# Patient Record
Sex: Male | Born: 2000 | Race: Black or African American | Hispanic: No | Marital: Single | Smoking: Current every day smoker
Health system: Southern US, Community
[De-identification: ages and names within clinical notes are randomized; demographics above are authoritative.]

---
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
## 2020-07-05 ENCOUNTER — Encounter: Payer: Self-pay | Admitting: Emergency Medicine

## 2020-07-05 ENCOUNTER — Emergency Department
Admission: EM | Admit: 2020-07-05 | Discharge: 2020-07-05 | Disposition: A | Discharge status: Home / Self Care | Payer: Medicaid Other | Attending: Student in an Organized Health Care Education/Training Program | Admitting: Student in an Organized Health Care Education/Training Program

## 2020-07-05 ENCOUNTER — Emergency Department: Payer: Medicaid Other

## 2020-07-05 ENCOUNTER — Other Ambulatory Visit: Payer: Self-pay

## 2020-07-05 DIAGNOSIS — S6992XA Unspecified injury of left wrist, hand and finger(s), initial encounter: Secondary | ICD-10-CM

## 2020-07-05 DIAGNOSIS — W278XXA Contact with other nonpowered hand tool, initial encounter: Secondary | ICD-10-CM | POA: Insufficient documentation

## 2020-07-05 DIAGNOSIS — Y93E5 Activity, floor mopping and cleaning: Secondary | ICD-10-CM | POA: Insufficient documentation

## 2020-07-05 DIAGNOSIS — Z23 Encounter for immunization: Secondary | ICD-10-CM | POA: Insufficient documentation

## 2020-07-05 DIAGNOSIS — F17210 Nicotine dependence, cigarettes, uncomplicated: Secondary | ICD-10-CM | POA: Insufficient documentation

## 2020-07-05 DIAGNOSIS — S68625A Partial traumatic transphalangeal amputation of left ring finger, initial encounter: Secondary | ICD-10-CM | POA: Insufficient documentation

## 2020-07-05 MED ORDER — BACITRACIN ZINC 500 UNIT/GM EX OINT
TOPICAL_OINTMENT | Freq: Once | CUTANEOUS | Status: AC
  Administered 2020-07-05: 1 via TOPICAL
  Filled 2020-07-05: qty 1.8

## 2020-07-05 MED ORDER — TETANUS-DIPHTH-ACELL PERTUSSIS 5-2.5-18.5 LF-MCG/0.5 IM SUSY
0.5000 mL | PREFILLED_SYRINGE | Freq: Once | INTRAMUSCULAR | Status: AC
  Administered 2020-07-05: 0.5 mL via INTRAMUSCULAR
  Filled 2020-07-05: qty 0.5

## 2020-07-05 MED ORDER — ONDANSETRON HCL 4 MG/2ML IJ SOLN
INTRAMUSCULAR | Status: AC
  Filled 2020-07-05: qty 2

## 2020-07-05 MED ORDER — MORPHINE SULFATE (PF) 4 MG/ML IV SOLN
6.0000 mg | Freq: Once | INTRAVENOUS | Status: AC
  Administered 2020-07-05: 6 mg via INTRAVENOUS
  Filled 2020-07-05: qty 2

## 2020-07-05 MED ORDER — BUPIVACAINE HCL (PF) 0.5 % IJ SOLN
30.0000 mL | Freq: Once | INTRAMUSCULAR | Status: AC
Start: 2020-07-05 — End: 2020-07-05
  Administered 2020-07-05: 30 mL
  Filled 2020-07-05: qty 30

## 2020-07-05 MED ORDER — ONDANSETRON HCL 4 MG/2ML IJ SOLN
4.0000 mg | Freq: Once | INTRAMUSCULAR | Status: AC
  Administered 2020-07-05: 4 mg via INTRAVENOUS

## 2020-07-05 MED ORDER — CEPHALEXIN 500 MG PO CAPS: 500.0000 mg | ORAL_CAPSULE | Freq: Three times a day (TID) | ORAL | 0 refills | Status: AC

## 2020-07-05 MED ORDER — LIDOCAINE HCL (PF) 1 % IJ SOLN
5.0000 mL | Freq: Once | INTRAMUSCULAR | Status: AC
  Administered 2020-07-05: 5 mL via INTRADERMAL
  Filled 2020-07-05: qty 5
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
## 2020-07-05 NOTE — ED Notes (Signed)
X-ray at bedside

## 2020-07-05 NOTE — ED Notes (Signed)
Pt provided meal tray, ok per EDP
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
## 2020-12-18 ENCOUNTER — Emergency Department: Payer: Self-pay

## 2020-12-18 ENCOUNTER — Other Ambulatory Visit: Payer: Self-pay

## 2020-12-18 ENCOUNTER — Ambulatory Visit
Admission: EM | Admit: 2020-12-18 | Discharge: 2020-12-18 | Discharge status: Against Medical Advice | Payer: Medicaid Other

## 2020-12-18 ENCOUNTER — Emergency Department
Admission: EM | Admit: 2020-12-18 | Discharge: 2020-12-18 | Disposition: A | Discharge status: Home / Self Care | Payer: Self-pay | Attending: Emergency Medicine | Admitting: Emergency Medicine

## 2020-12-18 DIAGNOSIS — R42 Dizziness and giddiness: Secondary | ICD-10-CM | POA: Insufficient documentation

## 2020-12-18 DIAGNOSIS — R5381 Other malaise: Secondary | ICD-10-CM | POA: Insufficient documentation

## 2020-12-18 DIAGNOSIS — F17210 Nicotine dependence, cigarettes, uncomplicated: Secondary | ICD-10-CM | POA: Insufficient documentation

## 2020-12-18 DIAGNOSIS — N200 Calculus of kidney: Secondary | ICD-10-CM | POA: Insufficient documentation

## 2020-12-18 LAB — URINALYSIS, COMPLETE (UACMP) WITH MICROSCOPIC
Bilirubin Urine: NEGATIVE
Glucose, UA: NEGATIVE mg/dL
Ketones, ur: 80 mg/dL — AB
Leukocytes,Ua: NEGATIVE
Nitrite: NEGATIVE
Protein, ur: 100 mg/dL — AB
RBC / HPF: >50 RBC/hpf — ABNORMAL HIGH (ref 0–5)
Specific Gravity, Urine: 1.031 — ABNORMAL HIGH (ref 1.005–1.030)
pH: 6 (ref 5.0–8.0)

## 2020-12-18 MED ORDER — LACTATED RINGERS IV BOLUS
1000.0000 mL | Freq: Once | INTRAVENOUS | Status: AC
  Administered 2020-12-18: 1000 mL via INTRAVENOUS

## 2020-12-18 MED ORDER — KETOROLAC TROMETHAMINE 15 MG/ML IJ SOLN
15.0000 mg | Freq: Once | INTRAMUSCULAR | Status: DC
  Filled 2020-12-18: qty 1

## 2020-12-18 MED ORDER — KETOROLAC TROMETHAMINE 30 MG/ML IJ SOLN
INTRAMUSCULAR | Status: AC
  Administered 2020-12-18: 15 mg
  Filled 2020-12-18: qty 1

## 2020-12-18 MED ORDER — ACETAMINOPHEN 500 MG PO TABS
1000.0000 mg | ORAL_TABLET | Freq: Once | ORAL | Status: AC
  Administered 2020-12-18: 1000 mg via ORAL
  Filled 2020-12-18: qty 2

## 2020-12-18 MED ORDER — ONDANSETRON HCL 4 MG/2ML IJ SOLN
4.0000 mg | Freq: Once | INTRAMUSCULAR | Status: AC
  Administered 2020-12-18: 4 mg via INTRAVENOUS
  Filled 2020-12-18: qty 2

## 2020-12-18 NOTE — ED Triage Notes (Signed)
Pt comes with c/o generalized body aches. Pt states chills and hot flashes.

## 2020-12-18 NOTE — ED Provider Notes (Signed)
[facility]  ____________________________________________   Event Date/Time   First MD Initiated Contact with Patient 12/18/20 1551     (approximate)  I have reviewed the triage vital signs and the nursing notes.   HISTORY  Chief Complaint Generalized Body Aches    HPI Tabb L Arty Lantzy is a 20 y.o. male no past medical history who presents with right flank pain.  He noticed the pain when he woke up this morning.  Pain is located on the right side, does not radiate.  It is been constant since onset.  Has been associated with some lightheadedness and nausea but denies vomiting.  No urinary symptoms.  No fevers chills.  No chest pain or shortness of breath.  No history of kidney stones.  Denies radiation of the pain.         History reviewed. No pertinent past medical history.  There are no problems to display for this patient.   History reviewed. No pertinent surgical history.  Prior to Admission medications   Not on File    Allergies Patient has no known allergies.  No family history on file.  Social History Social History   Tobacco Use   Smoking status: Every Day    Packs/day: 0.25    Types: Cigarettes   Smokeless tobacco: Never  Substance Use Topics   Alcohol use: Yes    Review of Systems   Review of Systems  Constitutional:  Negative for activity change, chills and fever.  HENT:  Negative for congestion.   Respiratory:  Negative for shortness of breath.   Cardiovascular:  Negative for chest pain.  Gastrointestinal:  Positive for nausea. Negative for abdominal pain and vomiting.  Genitourinary:  Positive for flank pain. Negative for dysuria and hematuria.  All other systems reviewed and are negative.  Physical Exam Updated Vital Signs BP 111/72   Pulse (!) 56   Temp 97.7 F (36.5 C)   Resp 16   SpO2 98%   Physical Exam Vitals and nursing note reviewed.  Constitutional:      General: He is not in acute  distress.    Appearance: Normal appearance.  HENT:     Head: Normocephalic and atraumatic.  Eyes:     General: No scleral icterus.    Conjunctiva/sclera: Conjunctivae normal.  Pulmonary:     Effort: Pulmonary effort is normal. No respiratory distress.     Breath sounds: Normal breath sounds. No wheezing.  Abdominal:     General: There is no distension.     Tenderness: There is no abdominal tenderness. There is right CVA tenderness. There is no guarding.  Musculoskeletal:        General: No deformity or signs of injury.     Cervical back: Normal range of motion.     Comments: No lumbar spine or SI joint tenderness  Skin:    Coloration: Skin is not jaundiced or pale.  Neurological:     General: No focal deficit present.     Mental Status: He is alert and oriented to person, place, and time. Mental status is at baseline.  Psychiatric:        Mood and Affect: Mood normal.        Behavior: Behavior normal.     LABS (all labs ordered are listed, but only abnormal results are displayed)  Labs Reviewed  URINALYSIS, COMPLETE (UACMP) WITH MICROSCOPIC - Abnormal; Notable for the following components:      Result Value   Color, Urine  AMBER (*)    APPearance CLOUDY (*)    Specific Gravity, Urine 1.031 (*)    Hgb urine dipstick LARGE (*)    Ketones, ur 80 (*)    Protein, ur 100 (*)    RBC / HPF >50 (*)    Bacteria, UA RARE (*)    All other components within normal limits   ____________________________________________  EKG  N/a ____________________________________________  RADIOLOGY Ky Barban, personally viewed and evaluated these images (plain radiographs) as part of my medical decision making, as well as reviewing the written report by the radiologist.  ED MD interpretation: CT renal study shows a 3 mm obstructing stone    ____________________________________________   PROCEDURES  Procedure(s) performed (including Critical  Care):  Procedures   ____________________________________________   INITIAL IMPRESSION / ASSESSMENT AND PLAN / ED COURSE     Is a 20 year old male who presents with flank pain nausea and generalized malaise.  No fevers or chills.  On exam he appears somewhat uncomfortable he has right CVA tenderness but a benign abdomen.  His urine is notable for large RBCs.  CT renal stone protocol was obtained which shows a 3 mm obstructing stone.  After Toradol and fluids patient feeling much improved.  We discussed urology follow-up in 1 week.        ____________________________________________   FINAL CLINICAL IMPRESSION(S) / ED DIAGNOSES  Final diagnoses:  None     ED Discharge Orders     None        Note:  This document was prepared using Dragon voice recognition software and may include unintentional dictation errors.    Georga Hacking, MD 12/18/20 [phone]

## 2020-12-18 NOTE — Discharge Instructions (Addendum)
Please return to the emergency department if you develop fevers or unable to eat and drink.  Please follow-up with urology in 1 week.

## 2021-03-14 DIAGNOSIS — Z20822 Contact with and (suspected) exposure to covid-19: Secondary | ICD-10-CM | POA: Diagnosis not present

## 2021-12-27 ENCOUNTER — Other Ambulatory Visit: Payer: Self-pay

## 2021-12-27 ENCOUNTER — Emergency Department
Admission: EM | Admit: 2021-12-27 | Discharge: 2021-12-27 | Disposition: A | Discharge status: Home / Self Care | Payer: Medicaid Other | Attending: Emergency Medicine | Admitting: Emergency Medicine

## 2021-12-27 ENCOUNTER — Encounter: Payer: Self-pay | Admitting: Emergency Medicine

## 2021-12-27 DIAGNOSIS — X500XXA Overexertion from strenuous movement or load, initial encounter: Secondary | ICD-10-CM | POA: Insufficient documentation

## 2021-12-27 DIAGNOSIS — M5442 Lumbago with sciatica, left side: Secondary | ICD-10-CM | POA: Insufficient documentation

## 2021-12-27 DIAGNOSIS — Y990 Civilian activity done for income or pay: Secondary | ICD-10-CM | POA: Insufficient documentation

## 2021-12-27 DIAGNOSIS — G8929 Other chronic pain: Secondary | ICD-10-CM | POA: Insufficient documentation

## 2021-12-27 MED ORDER — CYCLOBENZAPRINE HCL 10 MG PO TABS: 10.0000 mg | ORAL_TABLET | Freq: Three times a day (TID) | ORAL | 0 refills | Status: AC | PRN

## 2021-12-27 NOTE — ED Notes (Signed)
See triage note. 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 Pt denies any other specific injuries. Pt in NAD.

## 2021-12-27 NOTE — ED Provider Notes (Signed)
[facility] Provider Note   Event Date/Time   First MD Initiated Contact with Patient 12/27/21 1246     (approximate) History  Back Pain  HPI Benjamin Bennett is a 21 y.o. male with a stated past medical history of left-sided low back pain that has been chronic since high school when he had an injury while lifting weights.  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  Patient does endorse intermittent numbness overlying the backside of his left lower extremity that is intermittent and positional.  Patient also endorses intermittent perineal tingling but denies any saddle anesthesia or bowel/bladder incontinence.  Patient denies any recent trauma. ROS: Patient currently denies any vision changes, tinnitus, difficulty speaking, facial droop, sore throat, chest pain, shortness of breath, abdominal pain, nausea/vomiting/diarrhea, dysuria, or weakness in any extremity   Physical Exam  Triage Vital Signs: ED Triage Vitals [12/27/21 1225]  Enc Vitals Group     BP      Pulse      Resp      Temp      Temp src      SpO2      Weight 219 lb 2.2 oz (99.4 kg)     Height 5\' 9"  (1.753 m)     Head Circumference      Peak Flow      Pain Score 8     Pain Loc      Pain Edu?      Excl. in GC?    Most recent vital signs: There were no vitals filed for this visit. General: Awake, oriented x4. CV:  Good peripheral perfusion.  Resp:  Normal effort.  Abd:  No distention.  Other:  Young adult African-American male sitting in bed in no acute distress.  Positive straight leg raise on the left.  No midline tenderness to palpation of the back ED Results / Procedures / Treatments   PROCEDURES: Critical Care performed: No Procedures MEDICATIONS ORDERED IN ED: Medications - No data to display IMPRESSION / MDM / ASSESSMENT AND PLAN / ED COURSE  I reviewed the triage vital signs and  the nursing notes.                             The patient is on the cardiac monitor to evaluate for evidence of arrhythmia and/or significant heart rate changes. Patient's presentation is most consistent with acute presentation with potential threat to life or bodily function. Patient presents for low back pain. Given History and Exam the patient appears to be at low risk for Spinal Cord Compression Syndrome, Vertebral Malignancy/Mets, acute Spinal Fracture, Vertebral Osteomyelitis, Epidural Abscess, Infected or Obstructing Kidney Stone.  Their presentation appears most likely to be secondary to non-emergent musculoskeletal etiology vs non-emergent disc herniation.  ED Workup: Defer imaging and labwork for outpatient follow up at this time.  Disposition: Discharge. Strict return precautions discussed with patient with full understanding. Advised patient to follow up promptly with primary care provider   FINAL CLINICAL IMPRESSION(S) / ED DIAGNOSES   Final diagnoses:  Chronic left-sided low back pain with left-sided sciatica   Rx / DC Orders   ED Discharge Orders          Ordered    cyclobenzaprine (FLEXERIL) 10 MG tablet  3 times daily PRN  12/27/21 1317           Note:  This document was prepared using Dragon voice recognition software and may include unintentional dictation errors.   Merwyn Katos, MD 12/27/21 [phone]

## 2021-12-27 NOTE — ED Triage Notes (Signed)
C/O ongoing mid-low back pain.  States usually sees a chiropractor for pain, but presents today because pain seems to be getting progressively worse.  AAOx3.  Skin warm and dry.  MAE equally and strong.  Gait steady.  Posture upright and relaxed. NAD

## 2022-06-21 IMAGING — DX DG FINGER RING 2+V*L*
3 series · 3 of 3 positions shown · non-contrast
Comparison: No prior.

CLINICAL DATA: Distal finger injury.

EXAM:
LEFT RING FINGER 2+V

[finger ap]
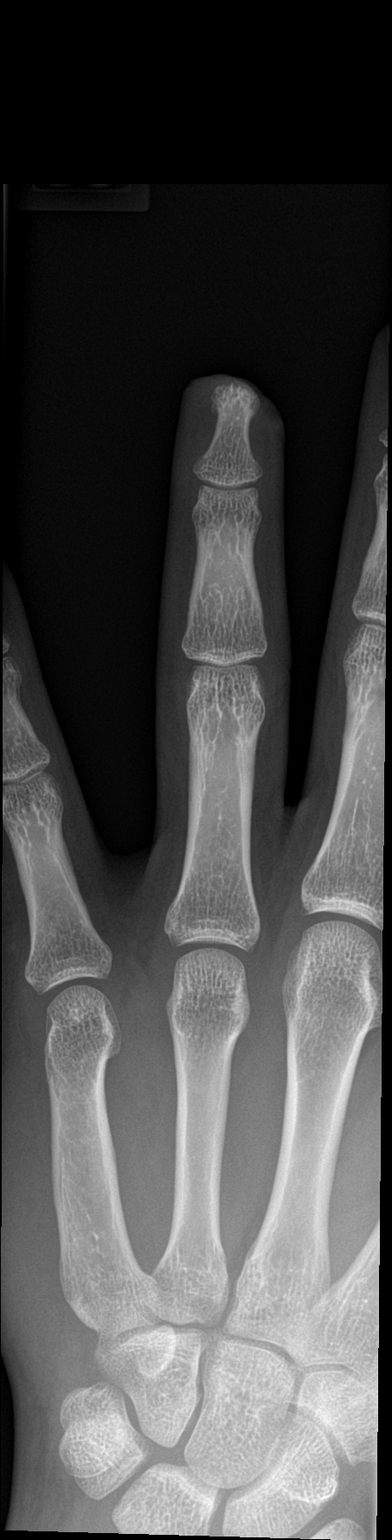

[finger obl]
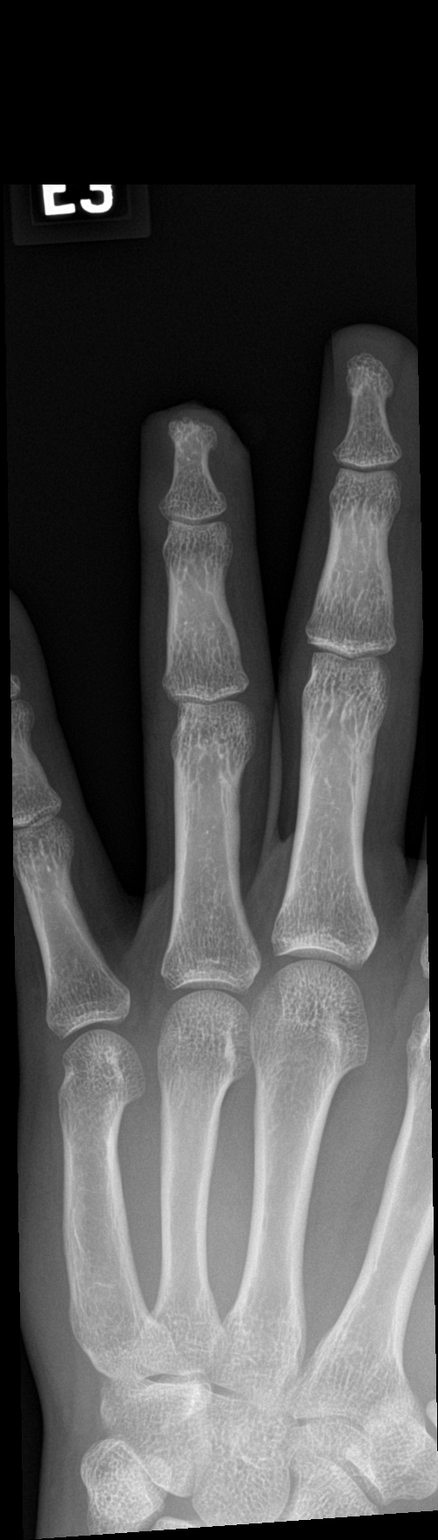

[finger lat]
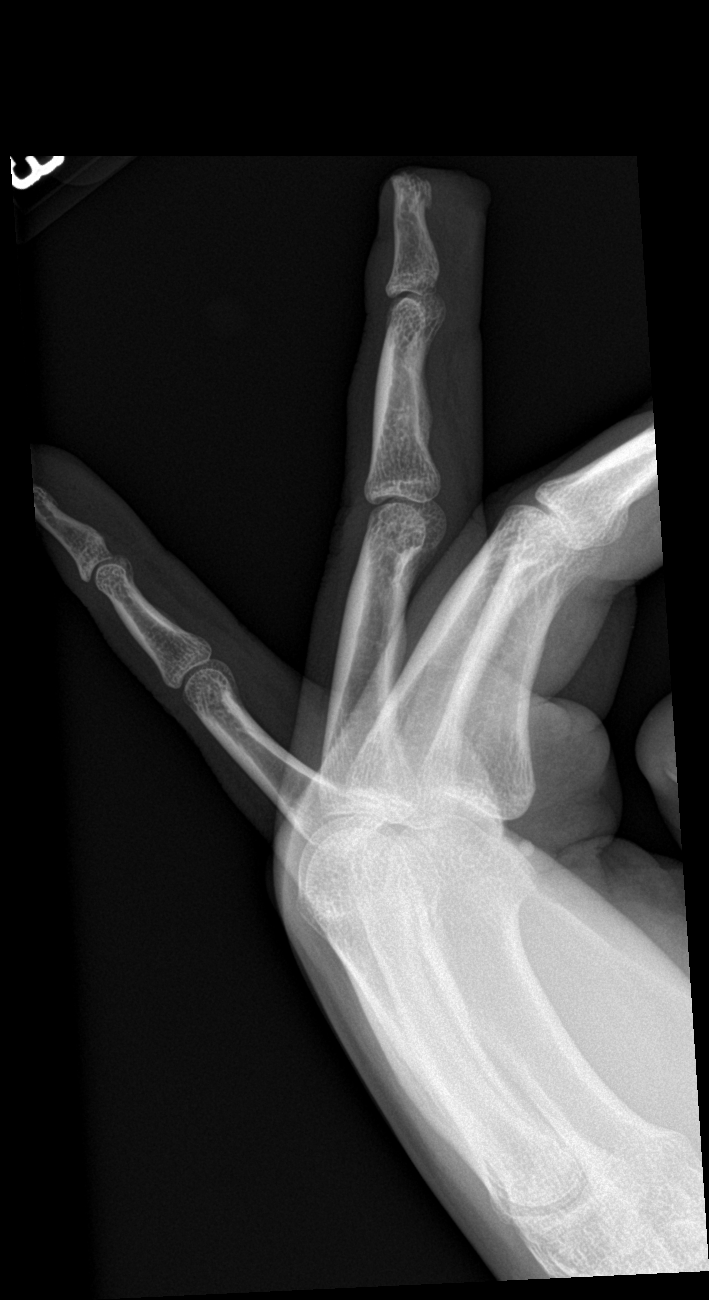

[3 of 3 positions shown; findings below may reference images not displayed]

FINDINGS: Amputation of the distal aspect of the left fourth digit noted.
Associated minimally displaced fracture of the distal tuft cannot be
excluded. No radiopaque foreign body.
IMPRESSION: Amputation of the distal aspect of the left fourth digit noted.
Associated minimally displaced fracture of the distal tuft cannot be
excluded.
